# Patient Record
Sex: Female | Born: 1965 | Race: White | Hispanic: No | Marital: Married | Smoking: Light tobacco smoker
Health system: Southern US, Community
[De-identification: ages and names within clinical notes are randomized; demographics above are authoritative.]

## PROBLEM LIST (undated history)

## (undated) DIAGNOSIS — Z90710 Acquired absence of both cervix and uterus: Secondary | ICD-10-CM

## (undated) HISTORY — DX: Acquired absence of both cervix and uterus: Z90.710

---

## 2009-01-31 ENCOUNTER — Ambulatory Visit: Payer: Self-pay | Admitting: Family Medicine

## 2009-01-31 DIAGNOSIS — J0100 Acute maxillary sinusitis, unspecified: Secondary | ICD-10-CM | POA: Insufficient documentation

## 2009-06-29 ENCOUNTER — Ambulatory Visit: Payer: Self-pay | Admitting: Physician Assistant

## 2009-06-29 ENCOUNTER — Encounter: Payer: Self-pay | Admitting: Physician Assistant

## 2009-06-30 ENCOUNTER — Encounter: Payer: Self-pay | Admitting: Physician Assistant

## 2009-06-30 LAB — CONVERTED CEMR LAB
ALT: 16 units/L (ref 0–35)
AST: 17 units/L (ref 0–37)
Albumin: 4.4 g/dL (ref 3.5–5.2)
Alkaline Phosphatase: 53 units/L (ref 39–117)
BUN: 12 mg/dL (ref 6–23)
Calcium: 9.2 mg/dL (ref 8.4–10.5)
HCT: 40.4 % (ref 36.0–46.0)
Hemoglobin: 13 g/dL (ref 12.0–15.0)
MCHC: 32.2 g/dL (ref 30.0–36.0)
MCV: 87.6 fL (ref 78.0–100.0)
Platelets: 318 10*3/uL (ref 150–400)
RBC: 4.61 M/uL (ref 3.87–5.11)
TSH: 2.329 microintl units/mL (ref 0.350–4.500)
Total Bilirubin: 0.4 mg/dL (ref 0.3–1.2)
Total Protein: 7.5 g/dL (ref 6.0–8.3)
WBC: 8.2 10*3/uL (ref 4.0–10.5)

## 2009-07-07 ENCOUNTER — Encounter: Admission: RE | Admit: 2009-07-07 | Discharge: 2009-07-07 | Payer: Self-pay | Admitting: Family Medicine

## 2009-07-13 ENCOUNTER — Ambulatory Visit: Payer: Self-pay | Admitting: Family Medicine

## 2009-07-13 DIAGNOSIS — M549 Dorsalgia, unspecified: Secondary | ICD-10-CM | POA: Insufficient documentation

## 2009-07-13 LAB — CONVERTED CEMR LAB
Nitrite: NEGATIVE
Specific Gravity, Urine: 1.02

## 2009-07-14 ENCOUNTER — Encounter: Payer: Self-pay | Admitting: Family Medicine

## 2009-07-14 LAB — CONVERTED CEMR LAB
Basophils Absolute: 0 10*3/uL (ref 0.0–0.1)
Basophils Relative: 0 % (ref 0–1)
Eosinophils Absolute: 0.1 10*3/uL (ref 0.0–0.7)
Eosinophils Relative: 1 % (ref 0–5)
HCT: 41 % (ref 36.0–46.0)
MCHC: 31.7 g/dL (ref 30.0–36.0)
MCV: 87.4 fL (ref 78.0–100.0)
Monocytes Absolute: 0.6 10*3/uL (ref 0.1–1.0)
Platelets: 299 10*3/uL (ref 150–400)
RDW: 13.6 % (ref 11.5–15.5)

## 2009-07-15 ENCOUNTER — Encounter: Admission: RE | Admit: 2009-07-15 | Discharge: 2009-07-15 | Payer: Self-pay | Admitting: Obstetrics & Gynecology

## 2009-08-04 ENCOUNTER — Encounter: Admission: RE | Admit: 2009-08-04 | Discharge: 2009-08-04 | Payer: Self-pay | Admitting: Obstetrics & Gynecology

## 2010-11-17 ENCOUNTER — Ambulatory Visit: Payer: Self-pay | Admitting: Obstetrics & Gynecology

## 2010-12-19 ENCOUNTER — Encounter: Payer: Self-pay | Admitting: Family Medicine

## 2011-01-11 ENCOUNTER — Other Ambulatory Visit: Payer: Self-pay | Admitting: Obstetrics & Gynecology

## 2011-01-11 ENCOUNTER — Ambulatory Visit (INDEPENDENT_AMBULATORY_CARE_PROVIDER_SITE_OTHER): Payer: BC Managed Care – PPO | Admitting: Obstetrics & Gynecology

## 2011-01-11 DIAGNOSIS — F329 Major depressive disorder, single episode, unspecified: Secondary | ICD-10-CM

## 2011-01-11 DIAGNOSIS — R102 Pelvic and perineal pain: Secondary | ICD-10-CM

## 2011-01-11 DIAGNOSIS — F3289 Other specified depressive episodes: Secondary | ICD-10-CM

## 2011-01-11 DIAGNOSIS — N926 Irregular menstruation, unspecified: Secondary | ICD-10-CM

## 2011-01-11 DIAGNOSIS — K5900 Constipation, unspecified: Secondary | ICD-10-CM

## 2011-01-12 ENCOUNTER — Ambulatory Visit (HOSPITAL_BASED_OUTPATIENT_CLINIC_OR_DEPARTMENT_OTHER)
Admission: RE | Admit: 2011-01-12 | Discharge: 2011-01-12 | Disposition: A | Discharge status: Home / Self Care | Payer: BC Managed Care – PPO | Source: Ambulatory Visit | Attending: Obstetrics & Gynecology | Admitting: Obstetrics & Gynecology

## 2011-01-12 ENCOUNTER — Encounter: Payer: Self-pay | Admitting: Obstetrics & Gynecology

## 2011-01-12 ENCOUNTER — Ambulatory Visit (INDEPENDENT_AMBULATORY_CARE_PROVIDER_SITE_OTHER)
Admission: RE | Admit: 2011-01-12 | Discharge: 2011-01-12 | Disposition: A | Discharge status: Home / Self Care | Payer: BC Managed Care – PPO | Source: Ambulatory Visit | Attending: Obstetrics & Gynecology | Admitting: Obstetrics & Gynecology

## 2011-01-12 DIAGNOSIS — N949 Unspecified condition associated with female genital organs and menstrual cycle: Secondary | ICD-10-CM

## 2011-01-12 DIAGNOSIS — R102 Pelvic and perineal pain: Secondary | ICD-10-CM

## 2011-01-12 DIAGNOSIS — N854 Malposition of uterus: Secondary | ICD-10-CM | POA: Insufficient documentation

## 2011-01-12 DIAGNOSIS — R109 Unspecified abdominal pain: Secondary | ICD-10-CM | POA: Insufficient documentation

## 2011-01-12 LAB — CONVERTED CEMR LAB
Basophils Absolute: 0.1 10*3/uL (ref 0.0–0.1)
Basophils Relative: 1 % (ref 0–1)
Eosinophils Absolute: 0.1 10*3/uL (ref 0.0–0.7)
MCHC: 32.4 g/dL (ref 30.0–36.0)
MCV: 86.3 fL (ref 78.0–100.0)
Neutro Abs: 4.8 10*3/uL (ref 1.7–7.7)
Neutrophils Relative %: 67 % (ref 43–77)
RDW: 13.9 % (ref 11.5–15.5)

## 2011-01-25 ENCOUNTER — Ambulatory Visit: Payer: BC Managed Care – PPO | Admitting: Obstetrics & Gynecology

## 2011-03-01 ENCOUNTER — Ambulatory Visit
Admission: RE | Admit: 2011-03-01 | Discharge: 2011-03-01 | Disposition: A | Discharge status: Home / Self Care | Payer: BC Managed Care – PPO | Source: Ambulatory Visit | Attending: Obstetrics & Gynecology | Admitting: Obstetrics & Gynecology

## 2011-03-01 ENCOUNTER — Other Ambulatory Visit: Payer: Self-pay | Admitting: Obstetrics & Gynecology

## 2011-03-01 ENCOUNTER — Ambulatory Visit (INDEPENDENT_AMBULATORY_CARE_PROVIDER_SITE_OTHER): Payer: BC Managed Care – PPO | Admitting: Obstetrics & Gynecology

## 2011-03-01 DIAGNOSIS — Z1231 Encounter for screening mammogram for malignant neoplasm of breast: Secondary | ICD-10-CM

## 2011-03-01 DIAGNOSIS — Z1272 Encounter for screening for malignant neoplasm of vagina: Secondary | ICD-10-CM

## 2011-03-01 DIAGNOSIS — F3289 Other specified depressive episodes: Secondary | ICD-10-CM

## 2011-03-01 DIAGNOSIS — N841 Polyp of cervix uteri: Secondary | ICD-10-CM

## 2011-03-01 DIAGNOSIS — F329 Major depressive disorder, single episode, unspecified: Secondary | ICD-10-CM

## 2011-03-01 DIAGNOSIS — Z01419 Encounter for gynecological examination (general) (routine) without abnormal findings: Secondary | ICD-10-CM

## 2011-03-03 NOTE — Assessment & Plan Note (Signed)
Jodi Dunn, [name] NO.:  [id number]  MEDICAL RECORD NO.:  [id number]           PATIENT TYPE:  LOCATION:  [facility] [place]           FACILITY:  PHYSICIAN:  Elsie Lincoln, MD      DATE OF BIRTH:  [birth year]  DATE OF SERVICE:  03/01/2011                                 CLINIC NOTE  The patient is a 45 year old female who presents for annual exam.  The patient still having irregular menses.  She had transvaginal ultrasound, which showed a polyp of 1.7 cm, which could possibly be normal as she was presecretory; however, she will not get a period for several more weeks or at least 2 weeks because the patient has a thickened endometrium and given that she is still not having periods regularly with a thick endometrium, I think an endometrial biopsy is warranted. The patient has agreed to endometrial biopsy, we will do that at a later date.  She is still complaining of depression and has agreed to see a therapist.  We gave the name of Festus Barren due to situational stress from being employed at [employer]; however, she feels like she had recent panic attack and thinks it is getting worse.  The patient is not suicidal, she is not drinking, she has a history of alcoholism.  She has a supportive husband.  She had recently started taking the Wellbutrin that Dr. Macon Large prescribe back in February.  PAST MEDICAL HISTORY:  No changes.  Depression, anxiety, alcoholism. Again the patient is not drinking.  PAST SURGICAL HISTORY:  Gallbladder removed and a D and C.  GYN HISTORY:  Abnormal bleeding as above.  No history of abnormal Pap smears.  Has a mammogram followup this week.  Had a benign cyst in 2010. No history of ovarian cyst, fibroid tumors, sexually transmitted diseases.  FAMILY HISTORY:  No history of breast cancer, ovarian cancer, uterine cancer, or colon cancer.  MEDICATIONS:  MiraLax, Wellbutrin.  ALLERGIES:  None.  PHYSICAL EXAMINATION:  VITAL  SIGNS:  Pulse 72, blood pressure 120/80, weight 156, height 6 feet 7 inches. GENERAL:  Well-nourished, well-developed, no apparent distress. HEENT:  Normocephalic, atraumatic.  Good dentition.  Thyroid, no masses. LUNGS:  Clear to auscultation bilaterally. HEART:  Regular rate and rhythm. BREASTS:  No masses, nontender.  No lymphadenopathy. ABDOMEN:  Soft, nontender.  No rebound, no guarding, no organomegaly, no hernia. GENITALIA:  Tanner V.  Vagina pink, normal rugae.  Cervix closed, however, there is endocervical polyp extruding from the os.  At this point, the patient was consented for endocervical polyp removal. The endocervical polyp was grasped with ring forceps and removed with small amount of bleeding that was relieved with silver nitrate and direct pressure.  ASSESSMENT/PLAN:  A 45 year old female for well-woman exam. 1. Pap smear. 2. Mammogram this week. 3. Endocervical polyp removal. 4. Bimanual exam was not performed.  After the endocervical polyp is removed, we will do a bimanual prior to the endometrial biopsy.  The patient is going to also receive Xanax prior to the biopsy as she has anxiety problem.  She will receive one dose of 0.5 mg.  ______________________________ Elsie Lincoln, MD    KL/MEDQ  D:  03/01/2011  T:  03/02/2011  Job:  [id number]

## 2011-03-23 ENCOUNTER — Other Ambulatory Visit: Payer: Self-pay | Admitting: Obstetrics & Gynecology

## 2011-03-23 ENCOUNTER — Ambulatory Visit (HOSPITAL_COMMUNITY): Payer: BC Managed Care – PPO | Admitting: Psychology

## 2011-03-23 ENCOUNTER — Other Ambulatory Visit: Payer: BC Managed Care – PPO | Admitting: Obstetrics & Gynecology

## 2011-03-23 DIAGNOSIS — N939 Abnormal uterine and vaginal bleeding, unspecified: Secondary | ICD-10-CM

## 2011-03-23 DIAGNOSIS — N926 Irregular menstruation, unspecified: Secondary | ICD-10-CM

## 2011-04-07 ENCOUNTER — Ambulatory Visit (HOSPITAL_COMMUNITY): Payer: BC Managed Care – PPO | Admitting: Psychology

## 2011-04-07 ENCOUNTER — Encounter (HOSPITAL_COMMUNITY): Payer: BC Managed Care – PPO | Admitting: Psychology
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
## 2011-04-19 ENCOUNTER — Ambulatory Visit: Payer: BC Managed Care – PPO | Admitting: Obstetrics & Gynecology

## 2011-04-28 ENCOUNTER — Ambulatory Visit (HOSPITAL_COMMUNITY): Payer: BC Managed Care – PPO | Admitting: Psychology

## 2011-05-02 ENCOUNTER — Ambulatory Visit (HOSPITAL_COMMUNITY): Payer: BC Managed Care – PPO | Admitting: Psychology

## 2011-06-09 NOTE — Assessment & Plan Note (Signed)
NAMESONDA, COPPENS                  ACCOUNT NO.:  [id number]  MEDICAL RECORD NO.:  [id number]          PATIENT TYPE:  POB  LOCATION:  [facility] [place]         FACILITY:  [facility]  PHYSICIAN:  Jaynie Collins, MD     DATE OF BIRTH:  [birth year]  DATE OF SERVICE:  01/11/2011                                 CLINIC NOTE  REASON FOR VISIT:  Evaluation of irregular menstrual periods.  Ms. Amacher is a 45 year old gravida 4, para 3-0-1-3 with a last menstrual period of November 05, 2010, who is here today for evaluation of irregular periods.  The patient says that over the last few months, she has had irregular menstrual periods and months of missed periods.  Her last menstrual period was in December 2011, so she has not had a period in 2 months.  She does not think she is pregnant as her husband had a vasectomy 15 years ago, and she does endorse having a lot of work- related stress.  The patient does endorse a history of depression and was prescribed Zoloft, but she does not take this as she said that it impacts her libido and she wants some medication that will not have adverse effect on her libido.  The patient also reports having hot flashes, fatigue, numbness, and tingling in hands.  She also is worried about constipation, which has been going on for the past 2 weeks.  She has been taking milk of magnesium, MiraLax, and occasional laxatives, but feels like it hurts when she goes to the bathroom.  She has significant lower abdominal pain and she wants further evaluation for this constipation.  She denies any abnormal vaginal discharge or any other concerns.  Of note, the patient was last seen in this clinic in August 2010.  PHYSICAL EXAMINATION:  VITAL SIGNS:  Temperature is 97.6, pulse 70, blood pressure is 134/87, weight 156 pounds, height 67 inches. GENERAL:  In no apparent distress. ABDOMEN:  Soft.  The patient does have moderate lower abdominal tenderness.  No rebound or  guarding. PELVIC:  Normal external female genitalia.  On bimanual exam, the patient does have globular retroverted uterus, and she has some uterine tenderness on palpation.  She also has right adnexal fullness and also has tenderness on palpation there.  No left adnexal tenderness.  The patient is diffusely tender on examination.  ASSESSMENT AND PLAN:  The patient is a 45 year old gravida 4, para 3-0-1- 3 here for evaluation of irregular periods and constipation, also with some pelvic pain. 1. Irregular menstrual periods.  This could be secondary to stress,     perimenopause, or other etiologies.  As such, the patient will have     laboratory evaluation to elucidate the etiology of her irregular     menses for follow-up on these results and manage accordingly.  As     for her pelvic pain, we will also obtain pelvic ultrasound to rule     out any gynecologic etiology of her pain.  If none is found, it is     likely that her pain is because of her gastrointestinal symptoms,     and we will manage accordingly. 2. Gastrointestinal symptoms/constipation.  The patient  was advised     about increasing fiber intake and also make it sure she keeps     hydrated.  The patient is very concerned about this and wants to be     referred to Gastroenterology, which will be done today; and they     will carry out any further imaging or other studies that they need     for evaluation. 3. Depression.  After looking at different medications for depression,     the patient is interested in a trial of Wellbutrin (bupropion HCl).     She will be prescribed 100 mg twice a day and after 3 days, we will     go up to 100 mg 3 times a day, and we will see if this will help     her symptoms and also follow the side effect profile.  The patient     is declining a referral to mental health specialist at this point,     but if this continues to be a problem, this will continue to be     advised.  The patient will  also be scheduled for mammogram given that her last mammogram was in 2010, and she will come back in 1-2 weeks to discuss the results of these laboratory evaluations and ultrasound.  At that visit, her annual Pap smear will also be carried out, and the patient was told to call or come back in for any further gynecologic concerns.          ______________________________ Jaynie Collins, MD    UA/MEDQ  D:  01/11/2011  T:  01/12/2011  Job:  [id number]

## 2012-12-30 IMAGING — US US PELVIS COMPLETE
1 series · 13 of 25 positions shown · non-contrast
Comparison: None.

CLINICAL DATA: Bilateral pelvic pain.  History of fibroids and
ovarian cysts.  No prior surgery.  LMP [DATE]



[Series 1: us pelvis complete · 0.32mm/px · 13 of 89 slices shown]
[im 1/89]
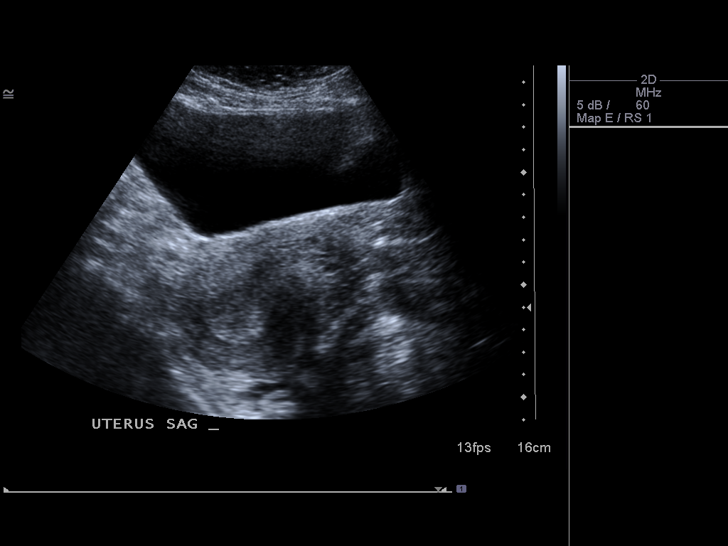
[im 8/89]
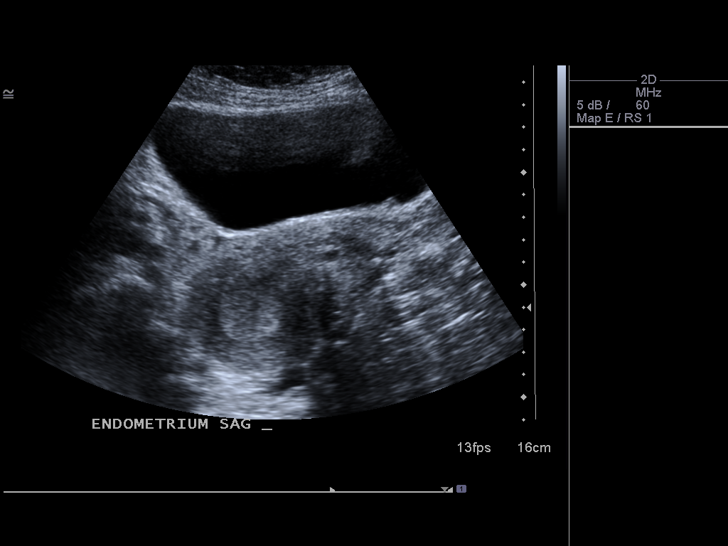
[im 15/89]
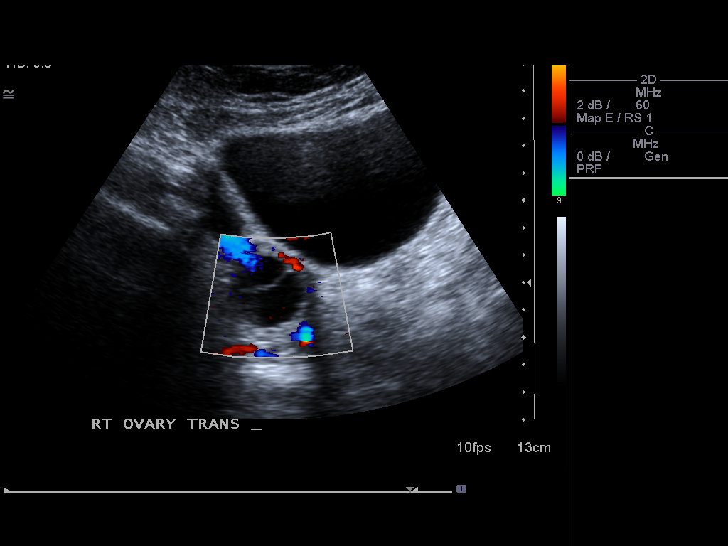
[im 23/89]
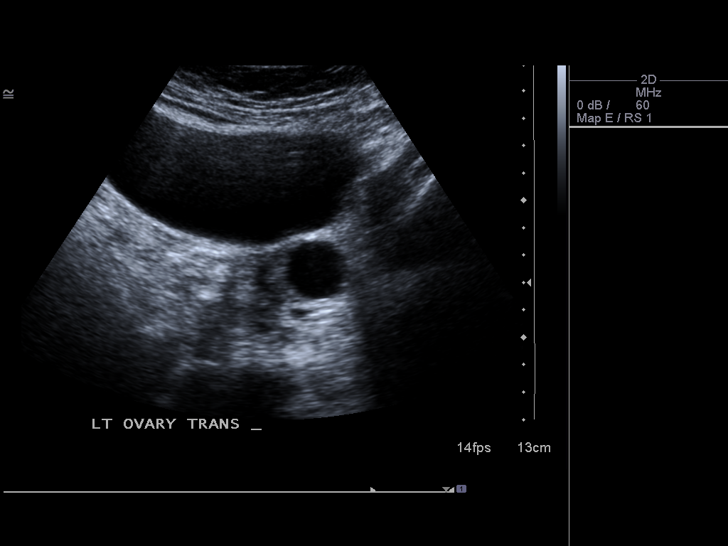
[im 30/89]
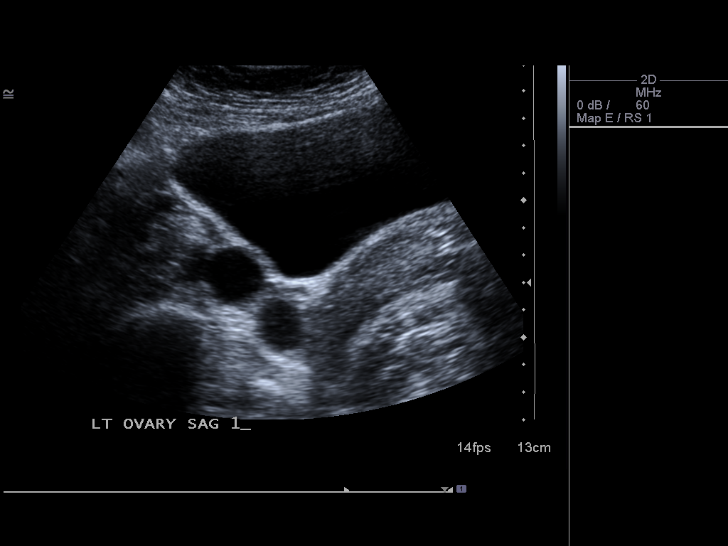
[im 37/89]
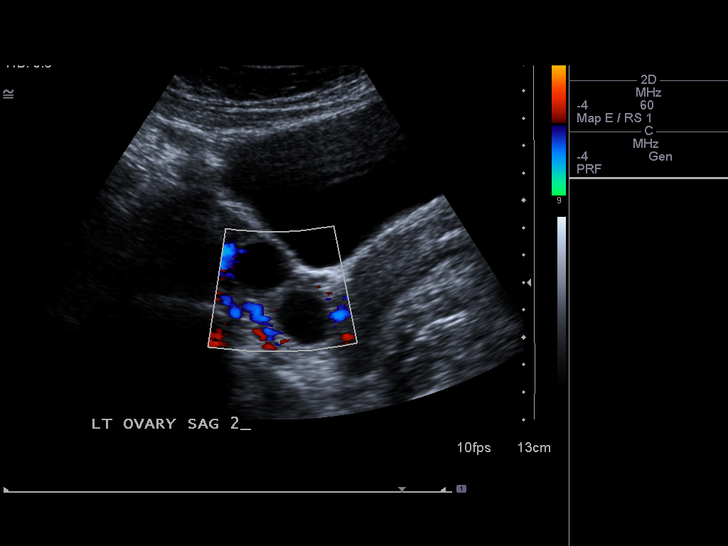
[im 45/89]
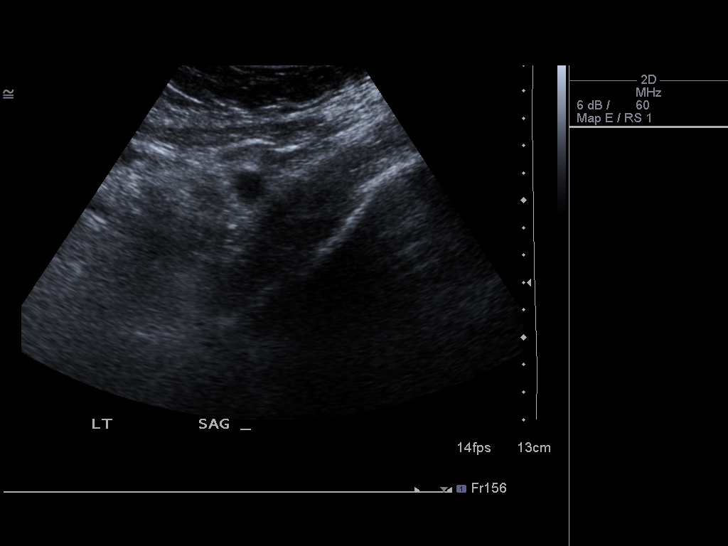
[im 52/89]
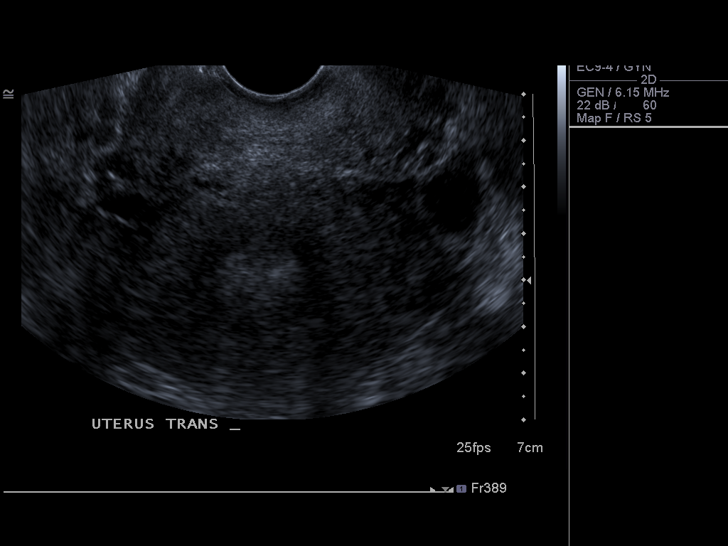
[im 59/89]
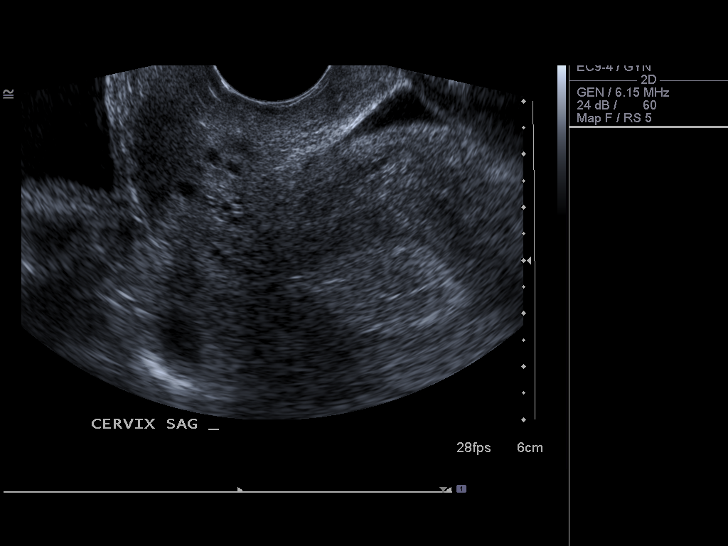
[im 67/89]
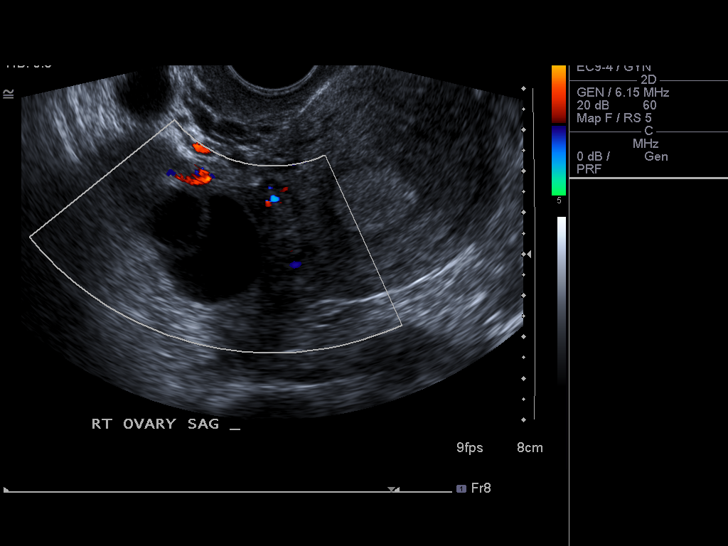
[im 74/89]
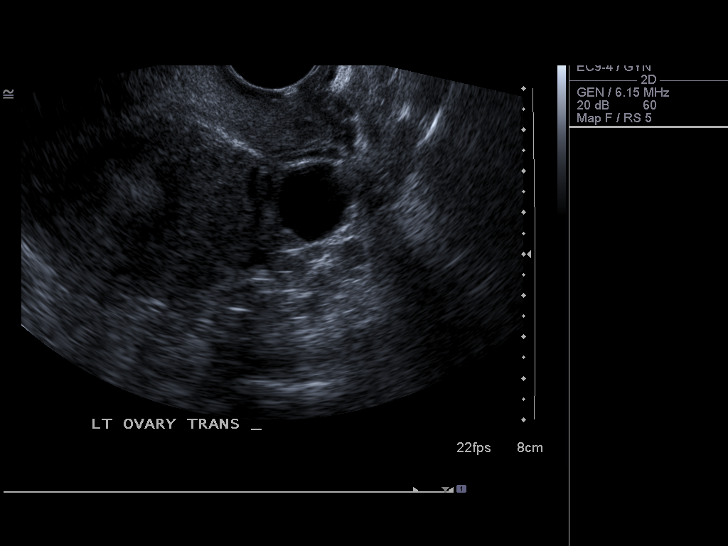
[im 81/89]
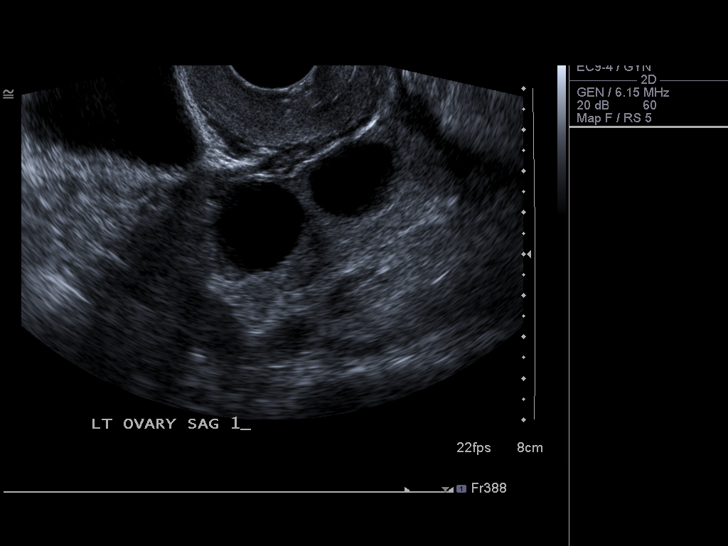
[im 89/89]
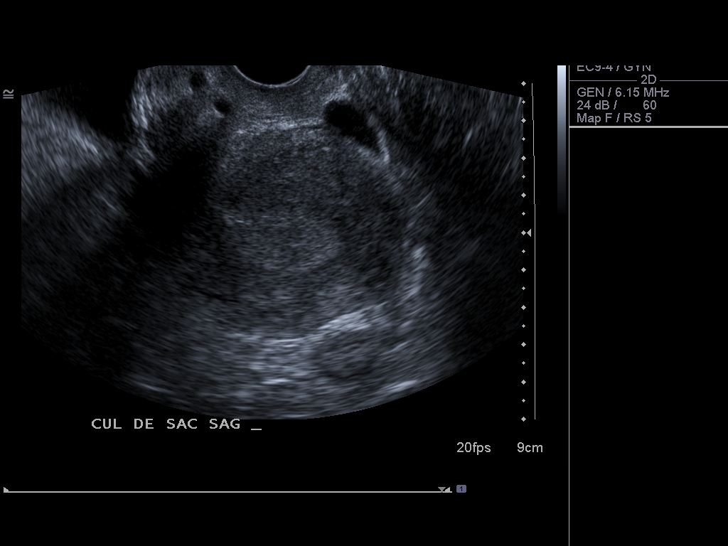

[13 of 25 positions shown; findings below may reference images not displayed]

FINDINGS: Uterus the uterus is retroverted and demonstrates a sagittal length
of 8.5 cm, an AP depth of 5.8 cm and a transverse width of 6.6 cm.
A homogeneous uterine myometrium is noted.

Endometrium is homogeneously echogenic with an AP width of 17 mm.
This is slightly prominent but in a premenopausal patient would
correlate with a presecretory phase of the cycle and correspond
with the lack of menses since [DATE].  No definite focal areas of
heterogeneity are identified.

Right Ovary measures 3.7 x 2.9 x 2.29 cm and contains several
follicles, the largest measuring 2.4 x 2.7 by 1.9 cm.

Left Ovary measures 5.0 x 2.5 x 2.9 cm and contains two unilocular
simple cystic foci measuring 2.1 x 2.2 x 2.1 cm of 1.4 x 2.3 x
cm compatible with follicles.

Other Findings:  A small amount of simple free fluid is noted in
the cul-de-sac.
IMPRESSION: Normal uterine myometrium and ovaries.

Slightly prominent endometrial thickness which may reflect the
presecretory nature of the cycle with no focal abnormalities
identified

## 2013-11-12 DIAGNOSIS — N814 Uterovaginal prolapse, unspecified: Secondary | ICD-10-CM | POA: Insufficient documentation

## 2013-11-12 DIAGNOSIS — N812 Incomplete uterovaginal prolapse: Secondary | ICD-10-CM | POA: Insufficient documentation

## 2014-10-01 ENCOUNTER — Ambulatory Visit (HOSPITAL_COMMUNITY): Payer: BC Managed Care – PPO | Admitting: Physician Assistant

## 2014-10-14 ENCOUNTER — Ambulatory Visit (INDEPENDENT_AMBULATORY_CARE_PROVIDER_SITE_OTHER): Payer: BC Managed Care – PPO | Admitting: Physician Assistant

## 2014-10-14 ENCOUNTER — Encounter (HOSPITAL_COMMUNITY): Payer: Self-pay | Admitting: Physician Assistant

## 2014-10-14 ENCOUNTER — Encounter (INDEPENDENT_AMBULATORY_CARE_PROVIDER_SITE_OTHER): Payer: Self-pay

## 2014-10-14 VITALS — BP 152/85 | HR 65 | Ht 67.0 in | Wt 173.0 lb

## 2014-10-14 DIAGNOSIS — F411 Generalized anxiety disorder: Secondary | ICD-10-CM

## 2014-10-14 MED ORDER — BUSPIRONE HCL 5 MG PO TABS: ORAL_TABLET | ORAL | Status: AC

## 2014-10-14 NOTE — Patient Instructions (Signed)
1. Continue all medication as ordered. 2. Call this office if you have any questions or concerns. 3. Continue to get regular exercise 3-5 times a week. 4. Continue to eat a healthy nutritionally balanced diet. 5. Continue to reduce stress and anxiety through activities such as yoga, mindfulness, meditation and or prayer. 6. Keep all appointments with your out patient therapist and have notes forwarded to this office. (If you do not have one and would like to be scheduled with a therapist, please let our office assist you with this. 7. Follow up as planned 3-4 weeks. 
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
## 2014-10-15 LAB — THYROID PANEL WITH TSH
FREE THYROXINE INDEX: 1.7 (ref 1.4–3.8)
T3 UPTAKE: 29 % (ref 22–35)
T4, Total: 5.7 ug/dL (ref 4.5–12.0)
TSH: 2.073 u[IU]/mL (ref 0.350–4.500)

## 2014-10-29 ENCOUNTER — Telehealth (HOSPITAL_COMMUNITY): Payer: Self-pay | Admitting: *Deleted

## 2014-10-29 NOTE — Telephone Encounter (Signed)
Pt would like another medication prescribed for anxiety.Pt currently is taking Buspar 5mg . Pt states she is having panic attacks and becoming more anixious due to family crisis. Please return call to [phone].

## 2014-11-04 ENCOUNTER — Ambulatory Visit (HOSPITAL_COMMUNITY): Payer: BC Managed Care – PPO | Admitting: Physician Assistant

## 2018-02-02 NOTE — Progress Notes (Signed)
error
